# Patient Record
Sex: Male | Born: 1970 | Race: White | Hispanic: No | Marital: Married | Smoking: Never smoker
Health system: Southern US, Community
[De-identification: ages and names within clinical notes are randomized; demographics above are authoritative.]

---

## 1999-07-18 ENCOUNTER — Encounter: Payer: Self-pay | Admitting: *Deleted

## 1999-07-18 ENCOUNTER — Encounter: Admission: RE | Admit: 1999-07-18 | Discharge: 1999-07-18 | Payer: Self-pay | Admitting: *Deleted

## 2001-08-02 ENCOUNTER — Encounter: Payer: Self-pay | Admitting: Specialist

## 2001-08-02 ENCOUNTER — Ambulatory Visit (HOSPITAL_COMMUNITY): Admission: RE | Admit: 2001-08-02 | Discharge: 2001-08-02 | Payer: Self-pay | Admitting: Specialist

## 2004-10-23 ENCOUNTER — Ambulatory Visit (HOSPITAL_COMMUNITY): Admission: RE | Admit: 2004-10-23 | Discharge: 2004-10-23 | Payer: Self-pay | Admitting: Orthopedic Surgery

## 2011-08-06 ENCOUNTER — Encounter: Payer: Self-pay | Admitting: *Deleted

## 2011-12-08 ENCOUNTER — Encounter: Payer: Self-pay | Admitting: Cardiovascular Disease

## 2017-12-22 ENCOUNTER — Encounter (HOSPITAL_COMMUNITY): Payer: Self-pay

## 2017-12-22 ENCOUNTER — Emergency Department (HOSPITAL_COMMUNITY)
Admission: EM | Admit: 2017-12-22 | Discharge: 2017-12-22 | Disposition: A | Discharge status: Home / Self Care | Payer: BLUE CROSS/BLUE SHIELD | Attending: Emergency Medicine | Admitting: Emergency Medicine

## 2017-12-22 ENCOUNTER — Other Ambulatory Visit: Payer: Self-pay

## 2017-12-22 ENCOUNTER — Emergency Department (HOSPITAL_COMMUNITY): Payer: BLUE CROSS/BLUE SHIELD

## 2017-12-22 DIAGNOSIS — R079 Chest pain, unspecified: Secondary | ICD-10-CM | POA: Insufficient documentation

## 2017-12-22 DIAGNOSIS — R0789 Other chest pain: Secondary | ICD-10-CM

## 2017-12-22 LAB — I-STAT TROPONIN, ED
TROPONIN I, POC: 0 ng/mL (ref 0.00–0.08)
Troponin i, poc: 0 ng/mL (ref 0.00–0.08)

## 2017-12-22 LAB — BASIC METABOLIC PANEL
ANION GAP: 8 (ref 5–15)
BUN: 18 mg/dL (ref 6–20)
CHLORIDE: 102 mmol/L (ref 98–111)
CO2: 27 mmol/L (ref 22–32)
Calcium: 9.3 mg/dL (ref 8.9–10.3)
Creatinine, Ser: 1.29 mg/dL — ABNORMAL HIGH (ref 0.61–1.24)
GFR calc Af Amer: >60 mL/min (ref >60)
GFR calc non Af Amer: >60 mL/min (ref >60)
GLUCOSE: 103 mg/dL — AB (ref 70–99)
Potassium: 4.1 mmol/L (ref 3.5–5.1)
Sodium: 137 mmol/L (ref 135–145)

## 2017-12-22 LAB — CBC
HCT: 45.3 % (ref 39.0–52.0)
HEMOGLOBIN: 15.1 g/dL (ref 13.0–17.0)
MCH: 29.7 pg (ref 26.0–34.0)
MCHC: 33.3 g/dL (ref 30.0–36.0)
MCV: 89 fL (ref 78.0–100.0)
Platelets: 170 10*3/uL (ref 150–400)
RBC: 5.09 MIL/uL (ref 4.22–5.81)
RDW: 12.2 % (ref 11.5–15.5)
WBC: 7.7 10*3/uL (ref 4.0–10.5)

## 2017-12-22 MED ORDER — NITROGLYCERIN 0.4 MG SL SUBL
SUBLINGUAL_TABLET | SUBLINGUAL | Status: AC
  Administered 2017-12-22: 0.4 mg
  Filled 2017-12-22: qty 1

## 2017-12-22 NOTE — Discharge Instructions (Signed)
Follow up with your PCP.  Return for worsening pain.

## 2017-12-22 NOTE — ED Provider Notes (Signed)
Patient placed in Quick Look pathway, seen and evaluated   Chief Complaint: Chest pain  HPI:   Presents via EMS for evaluation of chest pain.  He reports substernal nonradiating chest discomfort which began while at work today.  He was not exerting himself at the time, was at his computer.  He felt short of breath and dizzy with some nausea.  His coworkers called EMS and he received nitro in route which helped with his symptoms.  He also took 650 mg of aspirin prior to arrival on his own.  He continues to have chest pain.  No cardiac history.  No history of tobacco use, hypertension or hyperlipidemia.  ROS: + chest pain  Physical Exam:   Gen: No distress  Neuro: Awake and Alert  Skin: Warm    Focused Exam: Heart regular rate and rhythm. Lungs CTA.    Initiation of care has begun. The patient has been counseled on the process, plan, and necessity for staying for the completion/evaluation, and the remainder of the medical screening examination    Kellie ShropshireShrosbree, Emily J, PA-C 12/22/17 1434    Melene PlanFloyd, Dan, DO 12/22/17 1952

## 2017-12-22 NOTE — ED Notes (Signed)
Patient verbalizes understanding of discharge instructions. Opportunity for questioning and answers were provided. Ambulatory at discharge in NAD.  

## 2017-12-22 NOTE — ED Provider Notes (Signed)
[name]   CSN: [id number] Arrival date & time: 12/22/17  1302     History   Chief Complaint Chief Complaint  Patient presents with  . Chest Pain    HPI Christopher Burton is a 47 y.o. male.  47 yo M with a chief complaint of chest pain.  This been going on for the past 3 to 4 hours.  Started at rest while he was at work.  Felt like a pressure across the front of his chest.  Denies radiation.  Was somewhat pleuritic.  Nothing else seem to make it better or worse.  Denies diaphoresis nausea or vomiting.  He typically exercises at lunch and had no issue with exercise.  He has had a history of having chest pain with exercise in the past.  He denies history of hemoptysis unilateral lower extremity edema recent surgery or prolonged travel.  Denies testosterone use.  He denies hypertension hyperlipidemia diabetes or smoking.  Denies family history.  Denies prior history of MI, denies history of PE or DVT.  Denies history of cancer.  The history is provided by the patient.  Chest Pain   This is a new problem. The current episode started 3 to 5 hours ago. The problem occurs constantly. The problem has been gradually improving. The pain is associated with rest. The pain is present in the substernal region. The pain is at a severity of 8/10. The pain is moderate. The quality of the pain is described as pleuritic. The pain does not radiate. Duration of episode(s) is 4 hours. Associated symptoms include shortness of breath. Pertinent negatives include no abdominal pain, no fever, no headaches, no palpitations and no vomiting. He has tried nothing for the symptoms. The treatment provided no relief.  Pertinent negatives for past medical history include no diabetes, no DVT, no hyperlipidemia, no hypertension and no PE.  Pertinent negatives for family medical history include: no early MI.    Past Medical History:  Diagnosis Date  . Chest pain      There are no active problems to display for this patient.   History reviewed. No pertinent surgical history.      Home Medications    Prior to Admission medications   Not on File    Family History Family History  Problem Relation Age of Onset  . Coronary artery disease Unknown   . Cardiomyopathy Unknown     Social History Social History   Tobacco Use  . Smoking status: Never Smoker  . Smokeless tobacco: Never Used  Substance Use Topics  . Alcohol use: Yes    Comment: occasionally  . Drug use: Not on file     Allergies   Rocephin [ceftriaxone]   Review of Systems Review of Systems  Constitutional: Negative for chills and fever.  HENT: Negative for congestion and facial swelling.   Eyes: Negative for discharge and visual disturbance.  Respiratory: Positive for shortness of breath.   Cardiovascular: Positive for chest pain. Negative for palpitations.  Gastrointestinal: Negative for abdominal pain, diarrhea and vomiting.  Musculoskeletal: Negative for arthralgias and myalgias.  Skin: Negative for color change and rash.  Neurological: Negative for tremors, syncope and headaches.  Psychiatric/Behavioral: Negative for confusion and dysphoric mood.     Physical Exam Updated Vital Signs BP 119/67   Pulse 60   Temp 98 F (36.7 C) (Oral)   Resp 14   Ht 6\' 2"  (1.88 m)   Wt 88.9 kg  SpO2 100%   BMI 25.16 kg/m   Physical Exam  Constitutional: He is oriented to person, place, and time. He appears well-developed and well-nourished.  HENT:  Head: Normocephalic and atraumatic.  Eyes: Pupils are equal, round, and reactive to light. EOM are normal.  Neck: Normal range of motion. Neck supple. No JVD present.  Cardiovascular: Normal rate and regular rhythm. Exam reveals no gallop and no friction rub.  No murmur heard. Pulmonary/Chest: No respiratory distress. He has no wheezes. He exhibits no tenderness.  Abdominal: He exhibits no distension. There is no  tenderness. There is no rebound and no guarding.  Musculoskeletal: Normal range of motion.  Neurological: He is alert and oriented to person, place, and time.  Skin: No rash noted. No pallor.  Psychiatric: He has a normal mood and affect. His behavior is normal.  Nursing note and vitals reviewed.    ED Treatments / Results  Labs (all labs ordered are listed, but only abnormal results are displayed) Labs Reviewed  BASIC METABOLIC PANEL - Abnormal; Notable for the following components:      Result Value   Glucose, Bld 103 (*)    Creatinine, Ser 1.29 (*)    All other components within normal limits  CBC  I-STAT TROPONIN, ED  I-STAT TROPONIN, ED    EKG EKG Interpretation  Date/Time:  Tuesday December 22 2017 13:05:28 EDT Ventricular Rate:  55 PR Interval:  166 QRS Duration: 110 QT Interval:  430 QTC Calculation: 411 R Axis:   83 Text Interpretation:  Sinus bradycardia Otherwise normal ECG No old tracing to compare Confirmed by Melene Plan [phone]) on 12/22/2017 5:17:50 PM   Radiology Dg Chest 2 View  Result Date: 12/22/2017 CLINICAL DATA:  Chest pain EXAM: CHEST - 2 VIEW COMPARISON:  None. FINDINGS: Hyperinflation of the lungs. Heart and mediastinal contours are within normal limits. No focal opacities or effusions. No acute bony abnormality. IMPRESSION: Hyperinflation.  No active cardiopulmonary disease. Electronically Signed   By: Charlett Nose M.D.   On: 12/22/2017 13:58    Procedures Procedures (including critical care time)  Medications Ordered in ED Medications  nitroGLYCERIN (NITROSTAT) 0.4 MG SL tablet (0.4 mg  Given 12/22/17 1402)     Initial Impression / Assessment and Plan / ED Course  I have reviewed the triage vital signs and the nursing notes.  Pertinent labs & imaging results that were available during my care of the patient were reviewed by me and considered in my medical decision making (see chart for details).     47 yo M with a chief complaint of  chest pain.  This is atypical in nature.  He is PERC negative.  Initial trop and ecg unremarkable.    Delta negative.  D/c home.   7:51 PM:  I have discussed the diagnosis/risks/treatment options with the patient and family and believe the pt to be eligible for discharge home to follow-up with PCP. We also discussed returning to the ED immediately if new or worsening sx occur. We discussed the sx which are most concerning (e.g., sudden worsening pain, fever, inability to tolerate by mouth ) that necessitate immediate return. Medications administered to the patient during their visit and any new prescriptions provided to the patient are listed below.  Medications given during this visit Medications  nitroGLYCERIN (NITROSTAT) 0.4 MG SL tablet (0.4 mg  Given 12/22/17 1402)    The patient appears reasonably screen and/or stabilized for discharge and I doubt any other medical condition or other [facility]  requiring further screening, evaluation, or treatment in the ED at this time prior to discharge.    Final Clinical Impressions(s) / ED Diagnoses   Final diagnoses:  Atypical chest pain    ED Discharge Orders    None       Melene Plan, DO 12/22/17 1951

## 2017-12-22 NOTE — ED Triage Notes (Signed)
[facility]- pt coming from work with complaint of chest pain while at rest. Pain worse with exertion, some SOB and dizziness. 650mg  of aspirin, 1 nitro with decrease in pain. Skin warm and dry. No cardiac hx.

## 2019-12-31 IMAGING — DX DG CHEST 2V
3 series · 3 of 3 positions shown · non-contrast
Comparison: None.

CLINICAL DATA: Chest pain

EXAM:
CHEST - 2 VIEW

[w chest lat (1 of 2)]
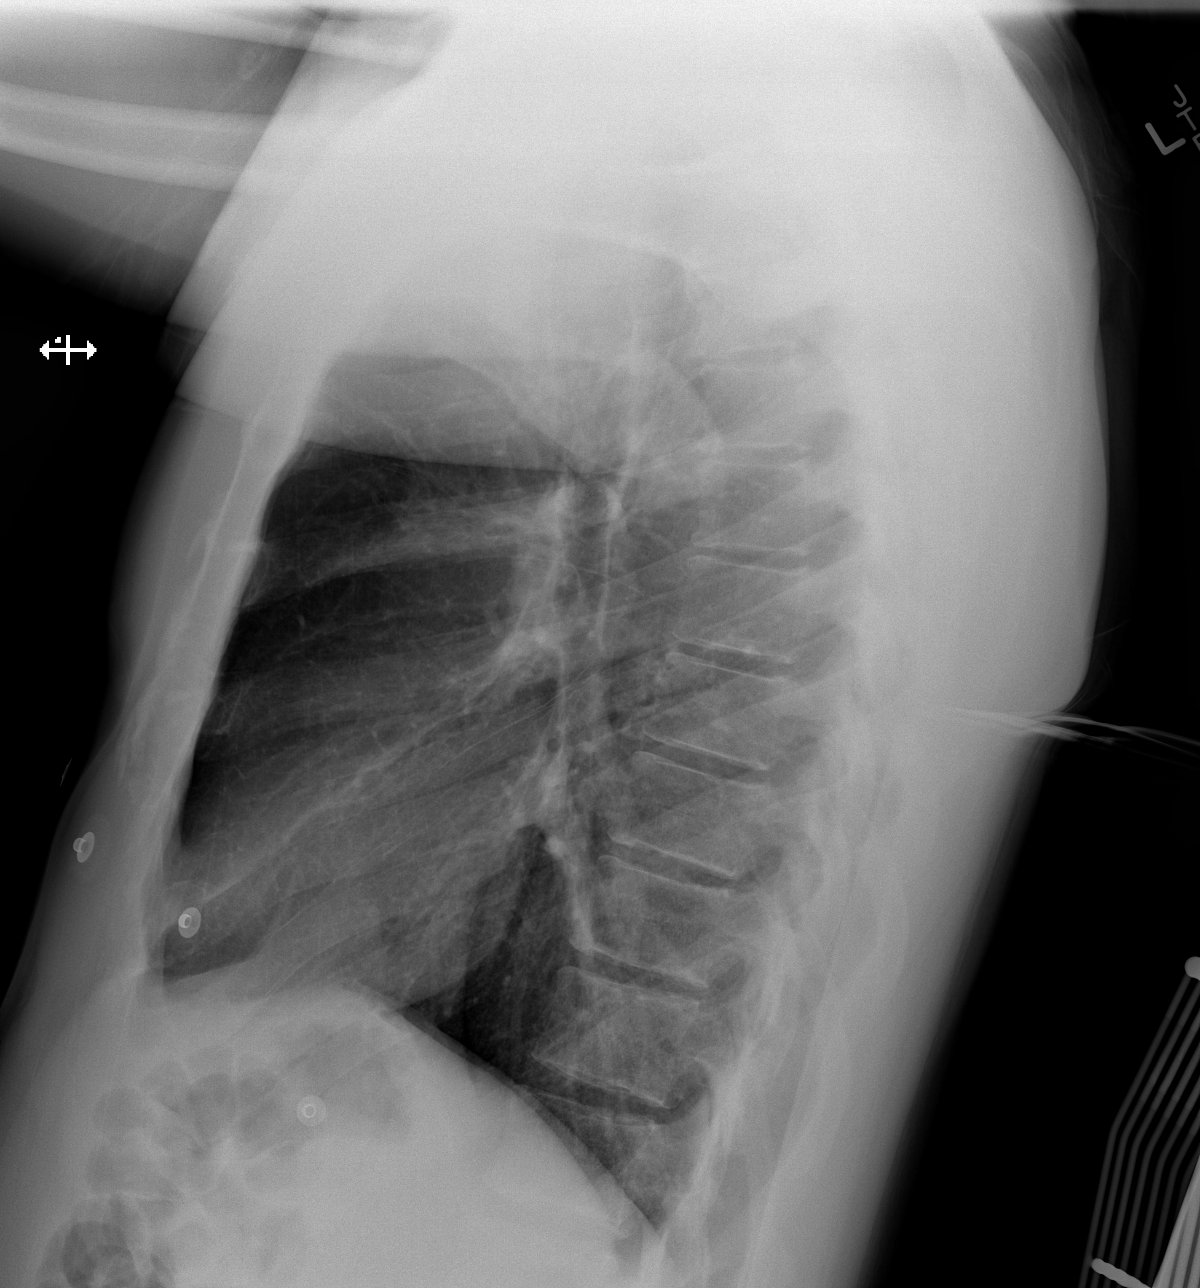

[w chest lat (2 of 2)]
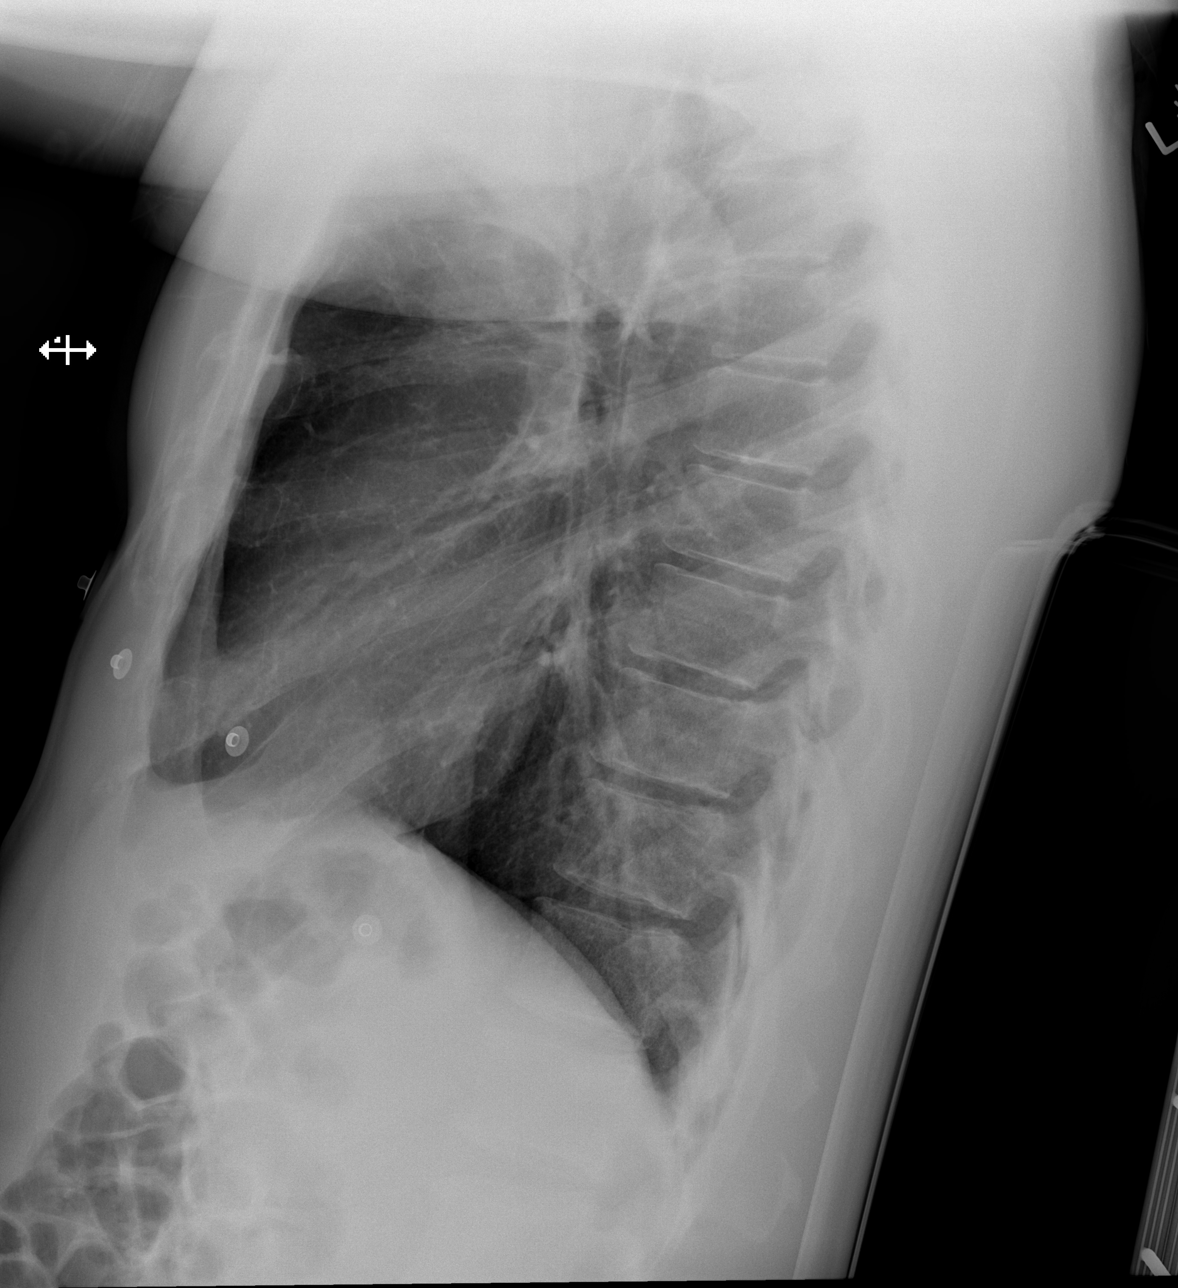

[w chest pa]
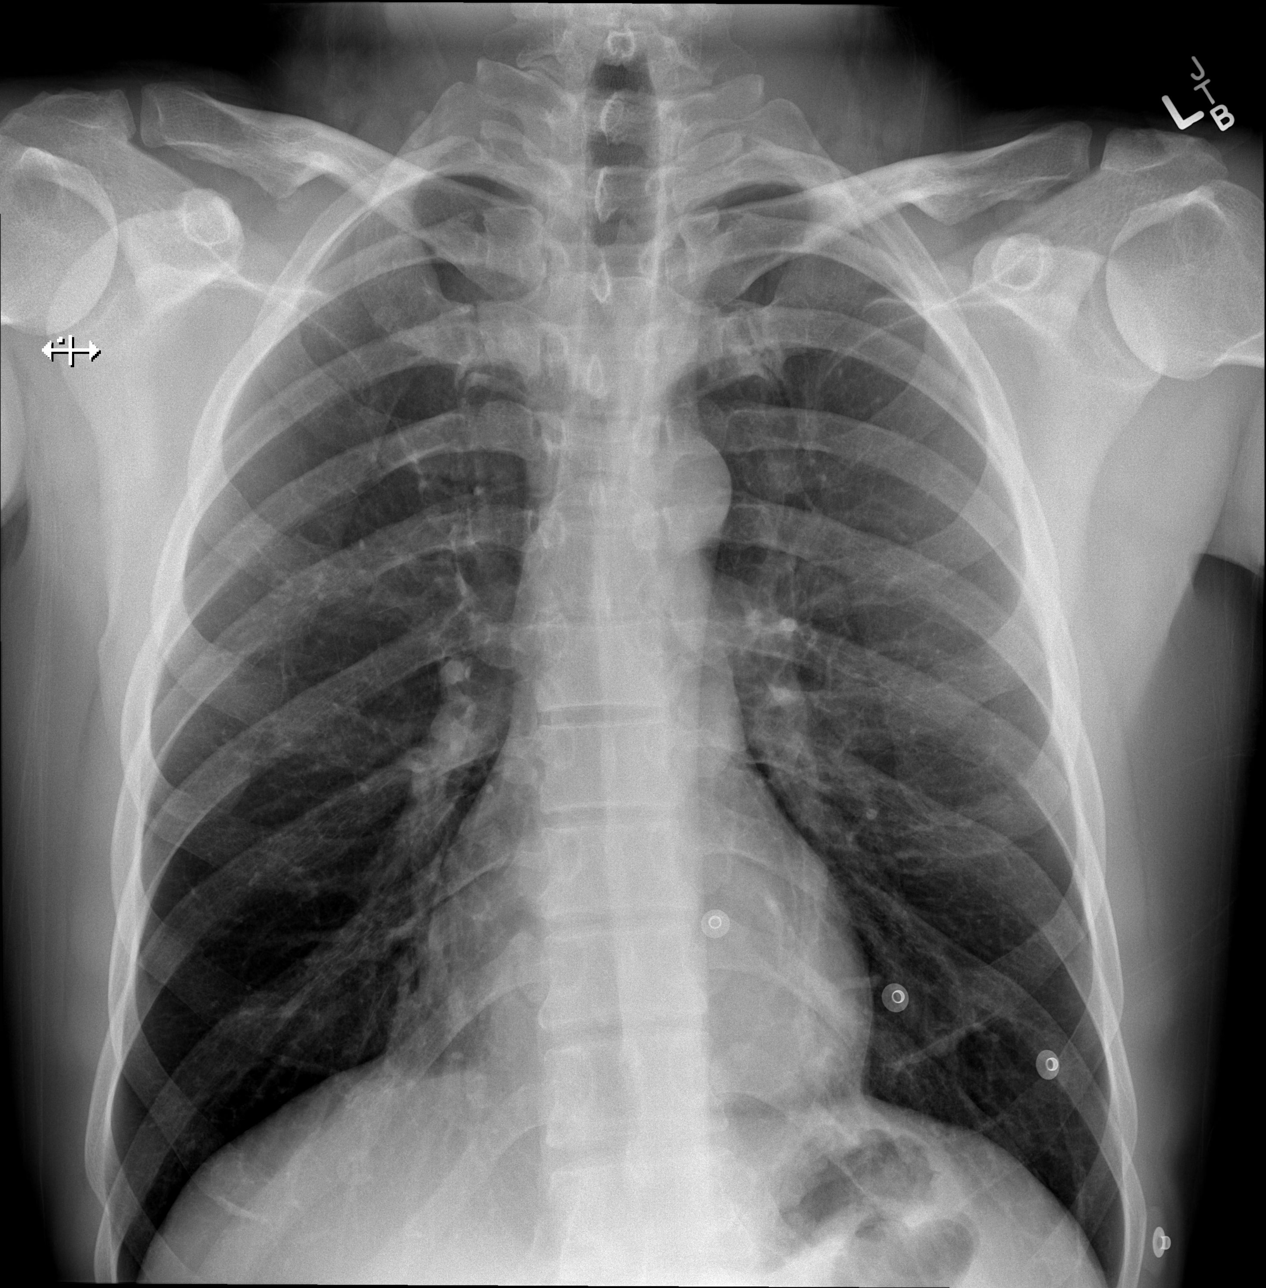

[3 of 3 positions shown; findings below may reference images not displayed]

FINDINGS: Hyperinflation of the lungs. Heart and mediastinal contours are
within normal limits. No focal opacities or effusions. No acute bony
abnormality.
IMPRESSION: Hyperinflation.  No active cardiopulmonary disease.
